# Patient Record
Sex: Male | Born: 1945 | Race: White | Hispanic: No
Health system: Southern US, Community
[De-identification: ages and names within clinical notes are randomized; demographics above are authoritative.]

---

## 2005-02-22 ENCOUNTER — Encounter: Admission: RE | Admit: 2005-02-22 | Discharge: 2005-02-22 | Payer: Self-pay | Admitting: Gastroenterology

## 2010-05-22 ENCOUNTER — Encounter: Payer: Self-pay | Admitting: Gastroenterology

## 2014-09-05 DIAGNOSIS — L03119 Cellulitis of unspecified part of limb: Secondary | ICD-10-CM | POA: Diagnosis not present

## 2014-10-08 DIAGNOSIS — H6122 Impacted cerumen, left ear: Secondary | ICD-10-CM | POA: Diagnosis not present

## 2014-10-08 DIAGNOSIS — D1720 Benign lipomatous neoplasm of skin and subcutaneous tissue of unspecified limb: Secondary | ICD-10-CM | POA: Diagnosis not present

## 2014-10-08 DIAGNOSIS — K649 Unspecified hemorrhoids: Secondary | ICD-10-CM | POA: Diagnosis not present

## 2014-10-08 DIAGNOSIS — R5383 Other fatigue: Secondary | ICD-10-CM | POA: Diagnosis not present

## 2014-10-18 ENCOUNTER — Emergency Department (HOSPITAL_COMMUNITY)
Admission: EM | Admit: 2014-10-18 | Discharge: 2014-10-19 | Disposition: A | Discharge status: Home / Self Care | Payer: Medicare Other | Attending: Emergency Medicine | Admitting: Emergency Medicine

## 2014-10-18 ENCOUNTER — Encounter (HOSPITAL_COMMUNITY): Payer: Self-pay | Admitting: Emergency Medicine

## 2014-10-18 DIAGNOSIS — Y92480 Sidewalk as the place of occurrence of the external cause: Secondary | ICD-10-CM | POA: Diagnosis not present

## 2014-10-18 DIAGNOSIS — S00511A Abrasion of lip, initial encounter: Secondary | ICD-10-CM | POA: Insufficient documentation

## 2014-10-18 DIAGNOSIS — S0450XA Injury of facial nerve, unspecified side, initial encounter: Secondary | ICD-10-CM | POA: Diagnosis not present

## 2014-10-18 DIAGNOSIS — Y998 Other external cause status: Secondary | ICD-10-CM | POA: Insufficient documentation

## 2014-10-18 DIAGNOSIS — S022XXA Fracture of nasal bones, initial encounter for closed fracture: Secondary | ICD-10-CM | POA: Diagnosis not present

## 2014-10-18 DIAGNOSIS — S161XXA Strain of muscle, fascia and tendon at neck level, initial encounter: Secondary | ICD-10-CM | POA: Diagnosis not present

## 2014-10-18 DIAGNOSIS — X58XXXA Exposure to other specified factors, initial encounter: Secondary | ICD-10-CM | POA: Diagnosis not present

## 2014-10-18 DIAGNOSIS — Y9389 Activity, other specified: Secondary | ICD-10-CM | POA: Diagnosis not present

## 2014-10-18 DIAGNOSIS — F10920 Alcohol use, unspecified with intoxication, uncomplicated: Secondary | ICD-10-CM

## 2014-10-18 DIAGNOSIS — Z0389 Encounter for observation for other suspected diseases and conditions ruled out: Secondary | ICD-10-CM | POA: Diagnosis not present

## 2014-10-18 DIAGNOSIS — F10120 Alcohol abuse with intoxication, uncomplicated: Secondary | ICD-10-CM | POA: Insufficient documentation

## 2014-10-18 DIAGNOSIS — T148 Other injury of unspecified body region: Secondary | ICD-10-CM | POA: Diagnosis not present

## 2014-10-18 DIAGNOSIS — S0990XA Unspecified injury of head, initial encounter: Secondary | ICD-10-CM | POA: Diagnosis not present

## 2014-10-18 DIAGNOSIS — M5032 Other cervical disc degeneration, mid-cervical region: Secondary | ICD-10-CM | POA: Diagnosis not present
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
## 2014-10-18 NOTE — ED Notes (Signed)
Pt transported to CT ?
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
## 2014-10-19 ENCOUNTER — Emergency Department (HOSPITAL_COMMUNITY): Payer: Medicare Other

## 2014-10-19 DIAGNOSIS — Z0389 Encounter for observation for other suspected diseases and conditions ruled out: Secondary | ICD-10-CM | POA: Diagnosis not present

## 2014-10-19 DIAGNOSIS — S022XXA Fracture of nasal bones, initial encounter for closed fracture: Secondary | ICD-10-CM | POA: Diagnosis not present

## 2014-10-19 DIAGNOSIS — M5032 Other cervical disc degeneration, mid-cervical region: Secondary | ICD-10-CM | POA: Diagnosis not present

## 2014-10-19 LAB — COMPREHENSIVE METABOLIC PANEL
ALK PHOS: 57 U/L (ref 38–126)
ALT: 15 U/L — ABNORMAL LOW (ref 17–63)
AST: 23 U/L (ref 15–41)
Albumin: 3.3 g/dL — ABNORMAL LOW (ref 3.5–5.0)
Anion gap: 12 (ref 5–15)
BUN: 11 mg/dL (ref 6–20)
CO2: 22 mmol/L (ref 22–32)
Calcium: 8.4 mg/dL — ABNORMAL LOW (ref 8.9–10.3)
Chloride: 101 mmol/L (ref 101–111)
Creatinine, Ser: 1.01 mg/dL (ref 0.61–1.24)
GFR calc non Af Amer: >60 mL/min (ref >60)
GLUCOSE: 108 mg/dL — AB (ref 65–99)
Potassium: 3 mmol/L — ABNORMAL LOW (ref 3.5–5.1)
Sodium: 135 mmol/L (ref 135–145)
TOTAL PROTEIN: 6.5 g/dL (ref 6.5–8.1)
Total Bilirubin: 0.9 mg/dL (ref 0.3–1.2)

## 2014-10-19 LAB — CBC WITH DIFFERENTIAL/PLATELET
Basophils Absolute: 0.1 10*3/uL (ref 0.0–0.1)
Basophils Relative: 1 % (ref 0–1)
Eosinophils Absolute: 0.1 10*3/uL (ref 0.0–0.7)
Eosinophils Relative: 1 % (ref 0–5)
HCT: 40.2 % (ref 39.0–52.0)
Hemoglobin: 14.5 g/dL (ref 13.0–17.0)
LYMPHS ABS: 2.4 10*3/uL (ref 0.7–4.0)
Lymphocytes Relative: 25 % (ref 12–46)
MCH: 32.2 pg (ref 26.0–34.0)
MCHC: 36.1 g/dL — ABNORMAL HIGH (ref 30.0–36.0)
MCV: 89.1 fL (ref 78.0–100.0)
Monocytes Absolute: 1 10*3/uL (ref 0.1–1.0)
Monocytes Relative: 10 % (ref 3–12)
NEUTROS ABS: 6 10*3/uL (ref 1.7–7.7)
NEUTROS PCT: 63 % (ref 43–77)
Platelets: 221 10*3/uL (ref 150–400)
RBC: 4.51 MIL/uL (ref 4.22–5.81)
RDW: 12.5 % (ref 11.5–15.5)
WBC: 9.5 10*3/uL (ref 4.0–10.5)

## 2014-10-19 LAB — ETHANOL: Alcohol, Ethyl (B): 249 mg/dL — ABNORMAL HIGH (ref <5)

## 2014-10-19 MED ORDER — SODIUM CHLORIDE 0.9 % IV SOLN
INTRAVENOUS | Status: AC | PRN
  Administered 2014-10-19: 1000 mL via INTRAVENOUS

## 2014-10-19 NOTE — ED Notes (Signed)
This RN has been speaking with pt. From what pt was telling this RN, he is depressed and lonely, but states that he does not want to hurt himself. Tamera Punt, MD and Leonette Monarch, MD notified.
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
## 2014-10-19 NOTE — ED Notes (Signed)
MD at bedside. 

## 2014-10-19 NOTE — Discharge Instructions (Signed)
Follow-up with [facility] in the next 2-3 days for a recheck.  Bacitracin twice daily over your facial wounds.  Return to the emergency department if symptoms significantly worsen or change.   Nasal Fracture A nasal fracture is a break or crack in the bones of the nose. A minor break usually heals in a month. You often will receive black eyes from a nasal fracture. This is not a cause for concern. The black eyes will go away over 1 to 2 weeks.  DIAGNOSIS  Your caregiver may want to examine you if you are concerned about a fracture of the nose. X-rays of the nose may not show a nasal fracture even when one is present. Sometimes your caregiver must wait 1 to 5 days after the injury to re-check the nose for alignment and to take additional X-rays. Sometimes the caregiver must wait until the swelling has gone down. TREATMENT Minor fractures that have caused no deformity often do not require treatment. More serious fractures where bones are displaced may require surgery. This will take place after the swelling is gone. Surgery will stabilize and align the fracture. HOME CARE INSTRUCTIONS   Put ice on the injured area.  Put ice in a plastic bag.  Place a towel between your skin and the bag.  Leave the ice on for 15-20 minutes, 03-04 times a day.  Take medications as directed by your caregiver.  Only take over-the-counter or prescription medicines for pain, discomfort, or fever as directed by your caregiver.  If your nose starts bleeding, squeeze the soft parts of the nose against the center wall while you are sitting in an upright position for 10 minutes.  Contact sports should be avoided for at least 3 to 4 weeks or as directed by your caregiver. SEEK MEDICAL CARE IF:  Your pain increases or becomes severe.  You continue to have nosebleeds.  The shape of your nose does not return to normal within 5 days.  You have pus draining from the nose. SEEK IMMEDIATE MEDICAL CARE IF:    You have bleeding from your nose that does not stop after 20 minutes of pinching the nostrils closed and keeping ice on the nose.  You have clear fluid draining from your nose.  You notice a grape-like swelling on the dividing wall between the nostrils (septum). This is a collection of blood (hematoma) that must be drained to help prevent infection.  You have difficulty moving your eyes.  You have recurrent vomiting. Document Released: 04/14/2000 Document Revised: 07/10/2011 Document Reviewed: 08/01/2010 [facility] Patient Information 2015 [place], [place]. This information is not intended to replace advice given to you by your health care provider. Make sure you discuss any questions you have with your health care provider. Emergency Department Resource Guide 1) Find a Doctor and Pay Out of Pocket Although you won't have to find out who is covered by your insurance plan, it is a good idea to ask around and get recommendations. You will then need to call the office and see if the doctor you have chosen will accept you as a new patient and what types of options they offer for patients who are self-pay. Some doctors offer discounts or will set up payment plans for their patients who do not have insurance, but you will need to ask so you aren't surprised when you get to your appointment.  2) Contact Your Local Health Department Not all health departments have doctors that can see patients for sick visits, but many  do, so it is worth a call to see if yours does. If you don't know where your local health department is, you can check in your phone book. The CDC also has a tool to help you locate your state's health department, and many state websites also have listings of all of their local health departments.  3) Find a [facility] If your illness is not likely to be very severe or complicated, you may want to try a walk in clinic. These are popping up all over the country in pharmacies,  drugstores, and shopping centers. They're usually staffed by nurse practitioners or physician assistants that have been trained to treat common illnesses and complaints. They're usually fairly quick and inexpensive. However, if you have serious medical issues or chronic medical problems, these are probably not your best option.   Chronic Pain Problems: Organization         Address     Phone             Notes  [facility]  [phone] Patients need to be referred by their primary care doctor.   Medication Assistance: Organization         Address     Phone             Notes  [facility] Medication [facility] [address] [place], [place] [address] [phone] --Must be a resident of [facility] -- Must have NO insurance coverage whatsoever (no Medicaid/ Medicare, etc.) -- The pt. MUST have a primary care doctor that directs their care regularly and follows them in the community   MedAssist  [phone]   [employer]  [phone]    Agencies that provide inexpensive medical care: Organization         Address     Phone             Notes  [place]  [phone]   [facility]    [phone]   [facility] [place]ia [place], [place] [address] [phone]   [place] [address] [place] [phone]   Planned Parenthood    [phone]   [facility]    [phone]   [place] and [address], [place] Phone:  [phone], Fax:  [phone] Hours of Operation:  9 am - 6 pm, M-F.  Also accepts Medicaid/Medicare and self-pay.  [facility] for [place], [address], [place] Phone: [phone], Fax: [phone]. Hours of Operation:  8:30 am - 5:30 pm, M-F.  Also accepts Medicaid and self-pay.  [facility] [place] [address], [place] Phone:  [phone]   [place], [place], [place] [phone], Ext. [phone] Mondays & Thursdays: 7-9 AM.  First 15 patients are seen on a first come, first serve basis.   [facility] [address] [place] [address] [phone] Accepts Medicaid   [place] Providers:  Organization         Address     Phone             Notes  [facility] [address],  [phone] Also accepts self-pay patients.  [facility] [address],  [address] [place]  [phone]   [place], [address], [place] [phone]   [facility] [address], [place] [phone]   Lucianne Lei [address], [place]   [phone] Only accepts [place] Access [place] patients after they have their name applied to their card.   Self-Pay (no insurance) in [facility]:  Organization         Address     Phone             Notes  Sickle Cell Patients, [facility] [place] [phone]   [facility] [place] [phone]   [facility] [place]  [place], [place], [place] [phone]   [facility]/Dr. Osei-Bonsu  [address] [place] or [address], [place] [phone] Phone number for both [place] and [place] locations is the same.  [facility] [address], [place] [phone]   [facility] [address], [place] or [address] [phone]   [facility] [address] [place] [phone], phone; [phone], fax Sees patients 1st and 3rd Saturday of every month.  Must not qualify for public or private insurance (i.e. Medicaid,  Medicare, [place] Health Choice, Veterans' Benefits)  Household income should be no more than 200% of the poverty level The clinic cannot treat you if you are pregnant or think you are pregnant  Sexually transmitted diseases are not treated at the clinic.    Dental Care:  Organization         Address     Phone             Notes  [facility] Department of [facility] [phone] Accepts children up to age 50 who are enrolled in [place] or [place]; pregnant women with a Medicaid card; and children who have applied for Medicaid or [place] Health Choice, but were declined, whose parents can pay a reduced fee at time of service.  [facility] Department of [facility]  [address], [place] [phone] Accepts children up to age 43 who are enrolled in [place] or [place]; pregnant women with a Medicaid card; and children who have applied for Medicaid or [place] Health Choice, but were declined, whose parents can pay a reduced fee at time of service.  [place] Adult Dental Access PROGRAM  [place] [phone] Patients are seen by appointment only. Walk-ins are not accepted. [place] will see patients 44 years of age and older. Monday - Tuesday (8am-5pm) Most Wednesdays (8:30-5pm) $30 per visit, cash only  [facility] Adult Dental Access PROGRAM  [address], [facility] [phone] Patients are seen by appointment only. Walk-ins are not accepted. [facility] will see patients 34 years of age and older. One Wednesday Evening (Monthly: Volunteer Based).  $30 per visit, cash only  [place]  [phone] for adults; Children under age 39, call Graduate Pediatric Dentistry at [phone]. Children aged 53-14, please call [phone] to request a pediatric application.  Dental services are provided in all areas of dental care including  fillings, crowns and bridges, complete  and partial dentures, implants, gum treatment, root canals, and extractions. Preventive care is also provided. Treatment is provided to both adults and children. Patients are selected via a lottery and there is often a waiting list.   [facility] [address] [place]  [phone] [email or web address]   [facility] [address] [place], [place] [phone], Ext. [phone] Second and Fourth Thursday of each month, opens at 6:30 AM; Clinic ends at 9 AM.  Patients are seen on a first-come first-served basis, and a limited number are seen during each clinic.   [facility]  [address] Hillard Danker [place], [place] [phone]   Eligibility Requirements You must have lived in [place], [place], or [place] counties for at least the last three months.   You cannot be eligible for state or federal sponsored [employer], including [employer], [place], or [employer].   You generally cannot be eligible for healthcare insurance through your employer.    How to apply: Eligibility screenings are held every Tuesday and Wednesday afternoon from 1:00 pm until 4:00 pm. You do not need an appointment for the interview!  [facility] [address] [place], [place]   [place]  [facility] Department  [place]  [phone]    Behavioral Health Resources in the Community: Intensive Outpatient Programs Organization         Address     Phone             Notes  [place]. [address] [place], [place] [phone]   [facility] Outpatient [address], [place], [place]   ADS: Alcohol & Drug Svcs [address], [place], [place]   [place] [address]  [place], [place] or [phone]      Substance Abuse Resources Organization         Address     Phone             Notes  Alcohol and Drug Services  [phone]   [place]  [phone]   [facility] [place]   Chinita Pester  [phone]   Residential & Outpatient Substance Abuse Program  [phone]   Psychological Services Organization         Address     Phone             Notes  [facility]  [place]  [phone]   [place] [address], [place] or [phone]    Mobile Crisis Teams Organization         Address     Phone             Notes  [facility]atives, Mobile Crisis Care Unit  [phone]   Assertive Psychotherapeutic Services  [address] [place], [place]   [place] Levels [address], [place] [phone]    Self-Help/Support Groups Organization         Address     Phone             Notes  [place]. of [place] - variety of support groups  Tate Call for more information  Narcotics Anonymous (NA), [facility] [address], [employer] [place]  2 meetings at this location  Residential Treatment Programs Organization         Address     Phone             Notes  [facility] [address],    [place]  [phone]   [facility]  [address] [place] [address], [place], [place]   [place], [place] [phone] Admissions: 8am-3pm M-F  Incentives Substance [place] [address],    [place], [place] [phone]   [facility] [address] [place], [place], [place]   [facility] [address]  [place], [place]   [facility]ight Programs - Intensive Outpatient [address] Kristeen Mans [address], [place], [place]   [facility] ([place].) [place].,  [place],  [place] [phone]) [phone] or [phone]   Residential Treatment Services (RTS) [address] [place], [place] Accepts Medicaid  [facility] [place] [address]  [place] [phone] Substance Abuse/Addiction Treatment   [facility] Organization         Address     Phone             Notes  [facility]  [phone]   Domenic Schwab, PhD [address] Arlis Porta [place], [place]   6617162818 or [phone]   [place], [place] [phone]   [facility] [address], [place], [place] [phone] Insurance/Medicaid/sponsorship through [facility] and Families [address] [place]                                    [place], [place] [phone] [place] [address] [place], [place] [phone]    Dr. Adele Schilder  [phone]   [facility] Dept. 1) [address], [place] 2) [place] 3)  [address], [place] [phone]  [phone]   [place] [phone] or [phone] (After Hours)     [place]: Abuse and Neglect Organization         Address     Phone             Notes  Child/Elder Abuse Hotline  843-206-1638   Family Abuse Services  (717) 375-8299 24 hour crisis line  [facility] Sexual [place]  [place] & Substance Use Organization         Address     Phone             Notes  [employer]   ([phone] 003-0881 24 hour crisis line  Advance Access  [address] [place] [phone] Monday- Friday, walk-in,  8am-8pm  RTSA Detoxification & Crisis Stabilization  [phone]   Alcoholics Anonymous (  694-503-8882 [employer]   Narcotics Anonymous  [place] & Urgent Care Centers Organization         Address     Phone             Notes  [facility] Department  [place] at  [facility]  [phone]   [facility]  [phone]   [facility]  [phone] Uninsured patients meeting eligibility requirement  [place]  [phone]      [place]  [place]  [facility]   [place]  [phone]     Additional [facility] Resources Organization         Address     Phone             Notes  [facility]  [place]  334-084-2365 Medicaid, Nutrition, Medicine Assistance, Utility Assistance  [place] 5701283407   [place] [facility]are  3185695161   [place]  [place] of Selena [place]  [phone] Adult & family shelter, food, utility & rent assistance  24 Hour crisis line for those facing homelessness  [phone]   [facility] Transit  [phone] Roxy Manns, [facility] public transportation system  Homecare Providers  3085600582 HIV/AIDS Case Management, FREE HIV SCREEN  Medication Management  [phone])[phone] Ongoing medication assistance for patients meeting eligibility requirements  Medication Drop Box Locations: [place] Dept., [facility] Police Dept., [place] Dept., [facility] office  Safely rid of unused medications  [employer]  ([phone] Crisis assistance, medication, housing, food, utility assistance  [place].  [facility])  [phone]

## 2014-10-19 NOTE — ED Notes (Signed)
NAD at this time. Pt able to ambulate independently.

## 2014-10-19 NOTE — ED Notes (Signed)
Patient is resting comfortably. 

## 2014-10-19 NOTE — ED Notes (Signed)
Pt returned from CT °

## 2014-12-11 DIAGNOSIS — R21 Rash and other nonspecific skin eruption: Secondary | ICD-10-CM | POA: Diagnosis not present

## 2015-06-15 DIAGNOSIS — Z23 Encounter for immunization: Secondary | ICD-10-CM | POA: Diagnosis not present

## 2015-06-15 DIAGNOSIS — Z0000 Encounter for general adult medical examination without abnormal findings: Secondary | ICD-10-CM | POA: Diagnosis not present

## 2015-06-15 DIAGNOSIS — Z1389 Encounter for screening for other disorder: Secondary | ICD-10-CM | POA: Diagnosis not present

## 2015-06-15 DIAGNOSIS — Z136 Encounter for screening for cardiovascular disorders: Secondary | ICD-10-CM | POA: Diagnosis not present

## 2016-06-15 DIAGNOSIS — Z0000 Encounter for general adult medical examination without abnormal findings: Secondary | ICD-10-CM | POA: Diagnosis not present

## 2016-06-15 DIAGNOSIS — Z1389 Encounter for screening for other disorder: Secondary | ICD-10-CM | POA: Diagnosis not present

## 2016-06-15 DIAGNOSIS — E7800 Pure hypercholesterolemia, unspecified: Secondary | ICD-10-CM | POA: Diagnosis not present

## 2016-06-15 DIAGNOSIS — K640 First degree hemorrhoids: Secondary | ICD-10-CM | POA: Diagnosis not present

## 2017-01-15 IMAGING — CT CT MAXILLOFACIAL W/O CM
4 of 9 series · 13 of 47 positions shown, 15 images · non-contrast
Comparison: None.

EXAM:
CT HEAD WITHOUT CONTRAST

CT MAXILLOFACIAL WITHOUT CONTRAST
CT CERVICAL SPINE WITHOUT CONTRAST
TECHNIQUE: Multidetector CT imaging of the head, cervical spine, and
maxillofacial structures were performed using the standard protocol
without intravenous contrast. Multiplanar CT image reconstructions
of the cervical spine and maxillofacial structures were also
generated.

[Series 9: sagittal soft tissue · sagittal · 0.34mm/px · 3 of 80 slices shown]
[im 20/80  bone]
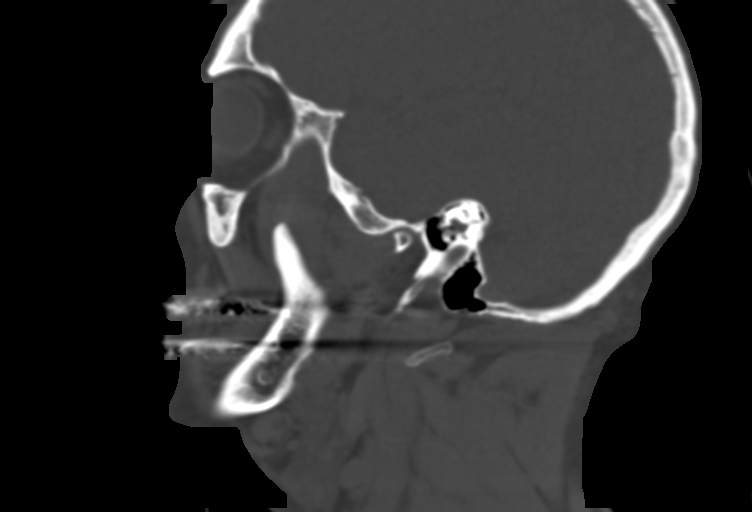
[im 40/80  bone]
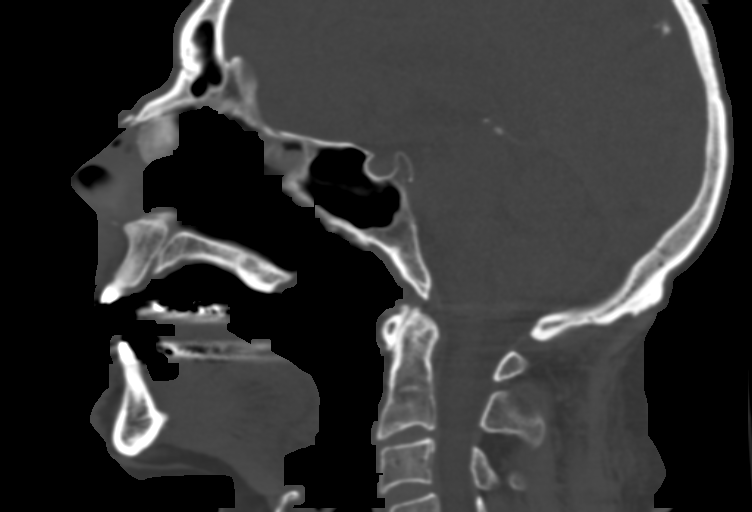
[im 60/80  bone]
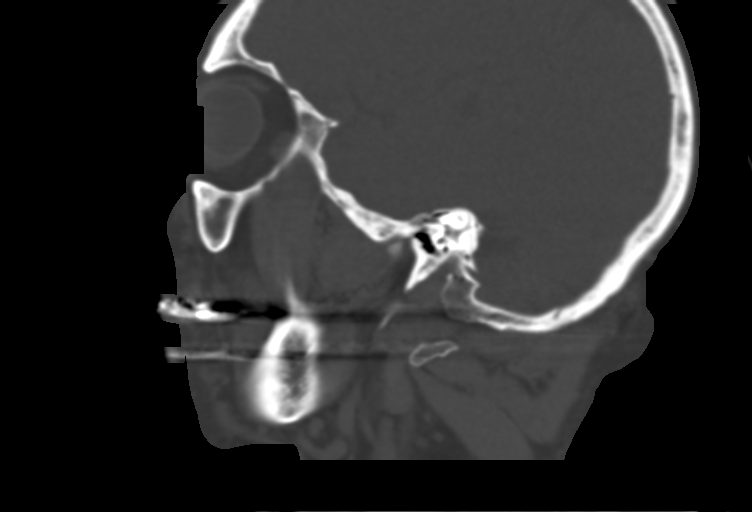

[Series 13: c_spine 2.0 i40s 3 · axial · 0.28mm/px · z∈[-321,-289]mm · 2 of 94 slices shown]
[im 16/94  bone]
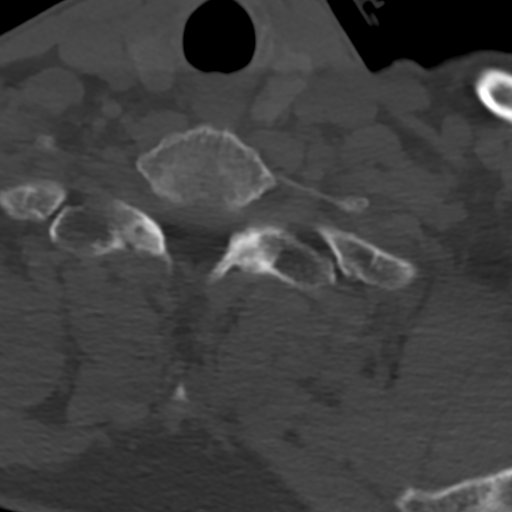
[im 32/94  bone]
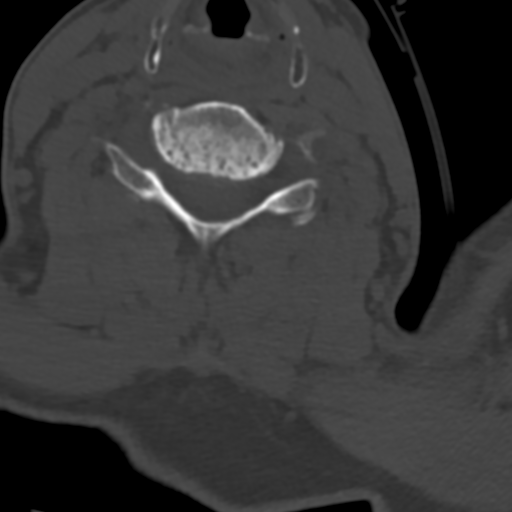

[Series 15: coronals · coronal · 0.19mm/px · 2 of 49 slices shown]
[im 13/49  bone]
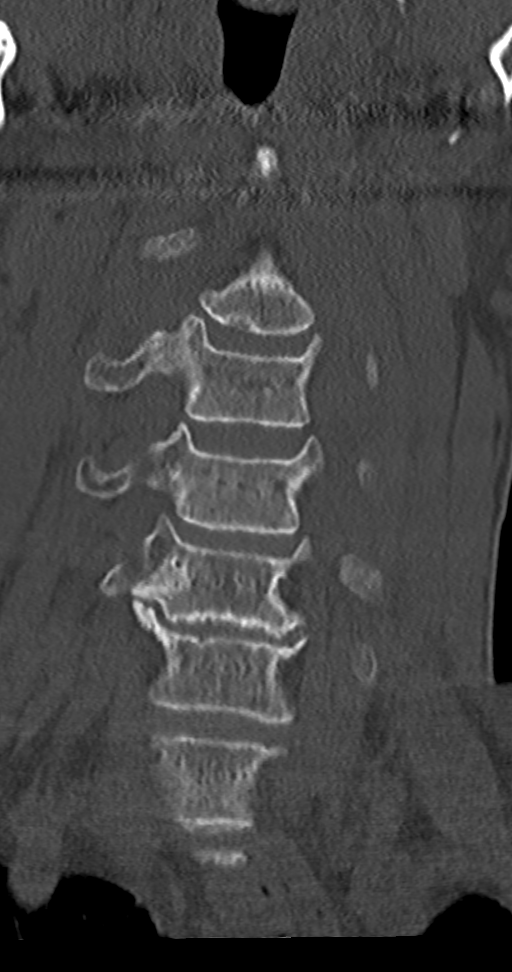
[im 31/49  bone]
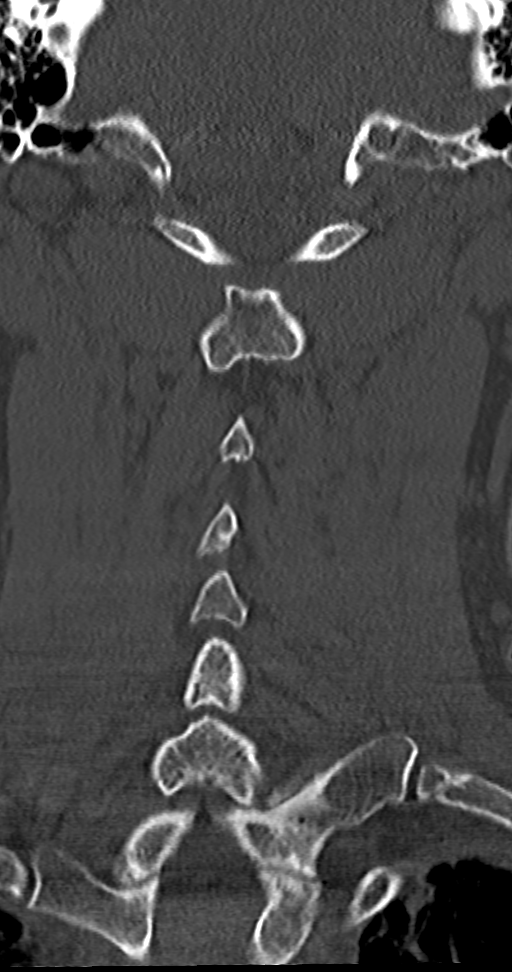

[Series 18: orthogonals · axial · 0.21mm/px · z∈[-340,-207]mm · 6 of 104 slices shown, 8 images]
[im 15/104  brain]
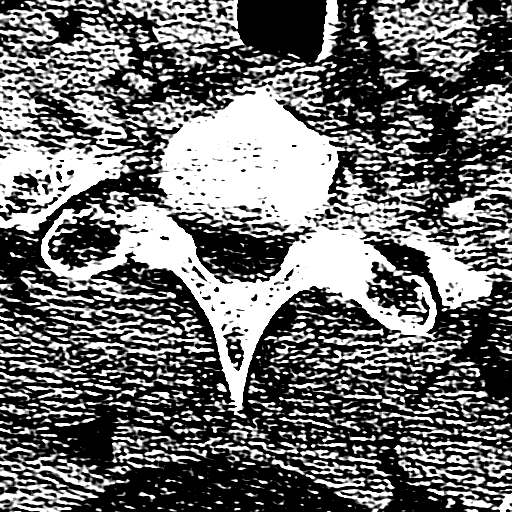
[im 15/104  bone]
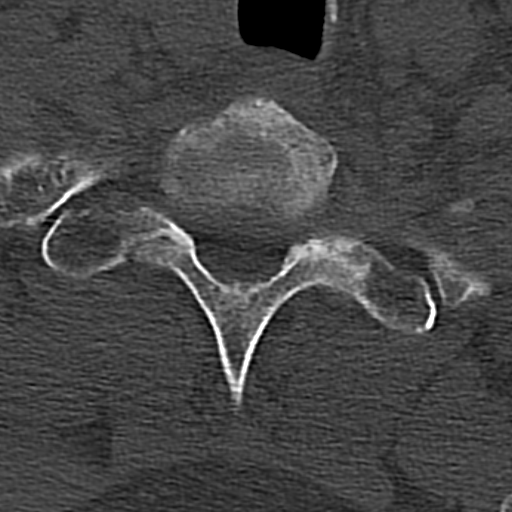
[im 30/104  bone]
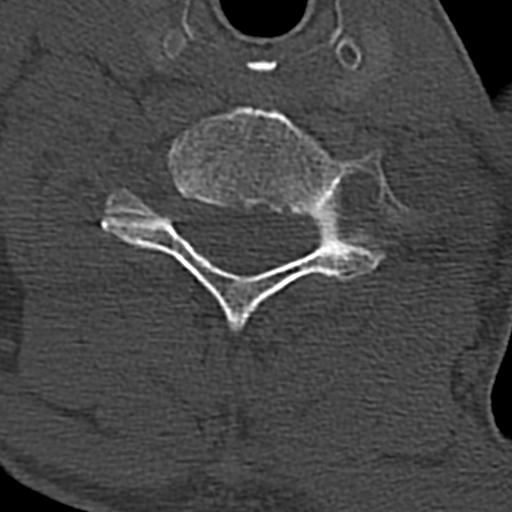
[im 45/104  bone]
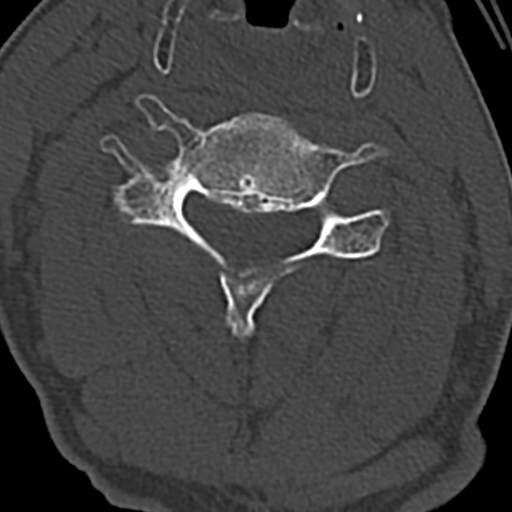
[im 59/104  bone]
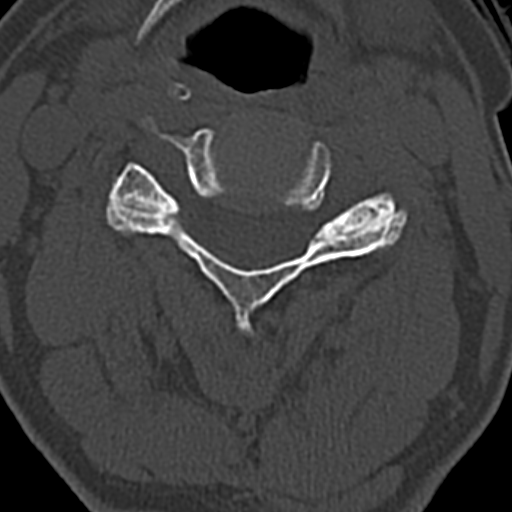
[im 74/104  brain]
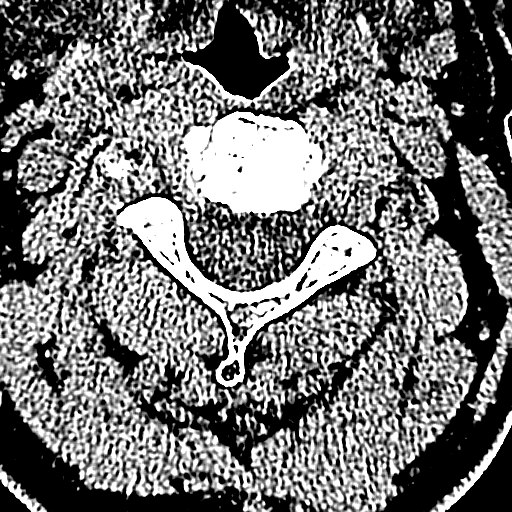
[im 74/104  bone]
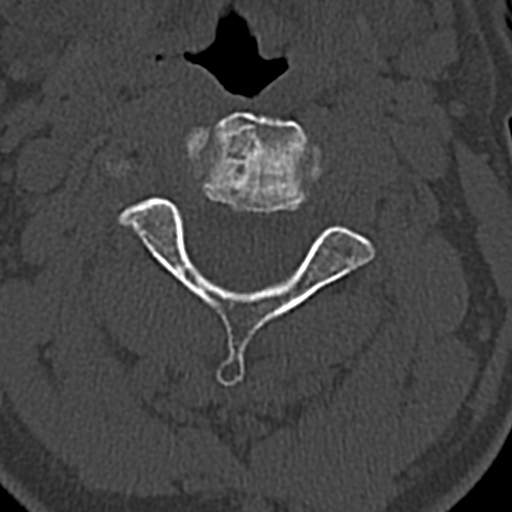
[im 89/104  bone]
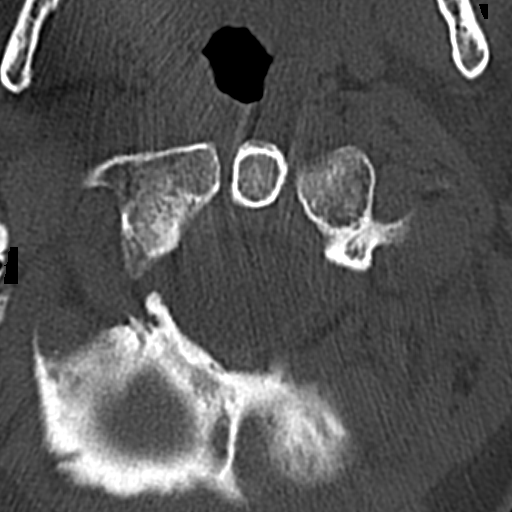

[13 of 47 positions shown; findings below may reference images not displayed]

FINDINGS: CT HEAD FINDINGS

There is no acute intracranial hemorrhage or infarct. No mass lesion
or midline shift. Gray-white matter differentiation is well
maintained. Ventricles are normal in size without evidence of
hydrocephalus. CSF containing spaces are within normal limits. No
extra-axial fluid collection. Mild chronic small vessel ischemic
changes present.

The calvarium is intact.

Orbital soft tissues are within normal limits.

The mastoid air cells are clear.

Scalp soft tissues are unremarkable.

CT MAXILLOFACIAL FINDINGS

Globes are intact. No retro-orbital process. Bony orbits are intact
without evidence orbital floor fracture. Zygomatic arches are
intact. No maxillary fracture. Pterygoid plates intact. Mandible
intact. Degenerative changes present at the right temporomandibular
joint.

There are comminuted bilateral nasal bone fractures with minimal
displacement. Probable acute nondisplaced fracture of the nasal
septum is well.

Chronic left maxillary sinus disease with desiccated secretions is
present. Mucosal thickening present within the ethmoidal air cells.
Mild mucosal thickening also present within the right maxillary
sinus.

CT CERVICAL SPINE FINDINGS

The vertebral bodies are normally aligned with preservation of the
normal cervical lordosis. Vertebral body heights are preserved.
Normal C1-2 articulations are intact. No prevertebral soft tissue
swelling. No acute fracture or listhesis. Small linear lucency
through the left lateral mass of C1 on coronal sequence favored to
reflect a normal nutrient foramen (series 15, image 21).

Moderate multilevel degenerative disc disease present as evidenced
by intervertebral disc space narrowing, endplate sclerosis, and
osteophytosis, most evident at C5-6.

2.5 cm hypodense nodule noted within the right lobe of thyroid.
Visualized lung apices are clear without evidence of apical
pneumothorax.
IMPRESSION: CT HEAD:

No acute intracranial process.

CT MAXILLOFACIAL:

1. Acute bilateral nasal bone fractures with minimal displacement.
2. Probable acute nondisplaced fracture through the nasal septum.
3. No other acute maxillofacial injury.
4. Chronic left maxillary sinus disease.

CT CERVICAL SPINE:

1. No acute traumatic injury within the cervical spine.
2. 2.5 cm right thyroid nodule, indeterminate. Further evaluation
with dedicated thyroid ultrasound recommended. This could be
performed on a nonemergent basis.

## 2017-06-21 DIAGNOSIS — Z1389 Encounter for screening for other disorder: Secondary | ICD-10-CM | POA: Diagnosis not present

## 2017-06-21 DIAGNOSIS — Z0000 Encounter for general adult medical examination without abnormal findings: Secondary | ICD-10-CM | POA: Diagnosis not present

## 2017-06-21 DIAGNOSIS — E7800 Pure hypercholesterolemia, unspecified: Secondary | ICD-10-CM | POA: Diagnosis not present

## 2017-06-21 DIAGNOSIS — R200 Anesthesia of skin: Secondary | ICD-10-CM | POA: Diagnosis not present

## 2017-10-30 DIAGNOSIS — T1512XA Foreign body in conjunctival sac, left eye, initial encounter: Secondary | ICD-10-CM | POA: Diagnosis not present

## 2018-06-27 DIAGNOSIS — Z1389 Encounter for screening for other disorder: Secondary | ICD-10-CM | POA: Diagnosis not present

## 2018-06-27 DIAGNOSIS — Z0000 Encounter for general adult medical examination without abnormal findings: Secondary | ICD-10-CM | POA: Diagnosis not present

## 2018-06-27 DIAGNOSIS — H6192 Disorder of left external ear, unspecified: Secondary | ICD-10-CM | POA: Diagnosis not present

## 2018-06-27 DIAGNOSIS — E7800 Pure hypercholesterolemia, unspecified: Secondary | ICD-10-CM | POA: Diagnosis not present

## 2019-06-30 DIAGNOSIS — Z1389 Encounter for screening for other disorder: Secondary | ICD-10-CM | POA: Diagnosis not present

## 2019-06-30 DIAGNOSIS — E7800 Pure hypercholesterolemia, unspecified: Secondary | ICD-10-CM | POA: Diagnosis not present

## 2019-06-30 DIAGNOSIS — Z0000 Encounter for general adult medical examination without abnormal findings: Secondary | ICD-10-CM | POA: Diagnosis not present

## 2019-08-09 ENCOUNTER — Ambulatory Visit: Payer: 59

## 2019-08-09 ENCOUNTER — Ambulatory Visit: Payer: Medicare Other | Attending: Internal Medicine

## 2019-08-09 DIAGNOSIS — Z23 Encounter for immunization: Secondary | ICD-10-CM

## 2019-08-09 NOTE — Progress Notes (Signed)
   Covid-[facility] Vaccination [facility]  Name:  Jesse Huerta    MRN: [id number] DOB: 09-20-1945  08/09/2019  [name] was observed post Covid-19 immunization for 15 minutes without incident. He was provided with Vaccine Information Sheet and instruction to access the V-Safe system.   [name] was instructed to call 911 with any severe reactions post vaccine: Marland Kitchen Difficulty breathing  . Swelling of face and throat  . A fast heartbeat  . A bad rash all over body  . Dizziness and weakness   Immunizations Administered    Name Date Dose VIS Date Route   Pfizer COVID-19 Vaccine 08/09/2019 10:03 AM 0.3 mL 04/11/2019 Intramuscular   Manufacturer: [place]   Lot: [id number]   [place]: [id number]

## 2019-09-01 ENCOUNTER — Ambulatory Visit: Payer: Medicare Other | Attending: Internal Medicine

## 2019-09-01 DIAGNOSIS — Z23 Encounter for immunization: Secondary | ICD-10-CM

## 2019-09-01 NOTE — Progress Notes (Signed)
   Covid-19 Vaccination Clinic  Name:  Jesse Huerta    MRN: [id number] DOB: 08-Apr-1946  09/01/2019  [name] was observed post Covid-19 immunization for 15 minutes without incident. He was provided with Vaccine Information Sheet and instruction to access the V-Safe system.   [name] was instructed to call 911 with any severe reactions post vaccine: Marland Kitchen Difficulty breathing  . Swelling of face and throat  . A fast heartbeat  . A bad rash all over body  . Dizziness and weakness   Immunizations Administered    Name Date Dose VIS Date [place] COVID-19 Vaccine 09/01/2019 12:36 PM 0.3 mL 06/25/2018 Intramuscular   Manufacturer: [place]   Lot: [id number]   [place]: [id number]

## 2020-07-01 DIAGNOSIS — R946 Abnormal results of thyroid function studies: Secondary | ICD-10-CM | POA: Diagnosis not present

## 2020-07-01 DIAGNOSIS — Z0000 Encounter for general adult medical examination without abnormal findings: Secondary | ICD-10-CM | POA: Diagnosis not present

## 2020-07-01 DIAGNOSIS — R030 Elevated blood-pressure reading, without diagnosis of hypertension: Secondary | ICD-10-CM | POA: Diagnosis not present

## 2020-07-01 DIAGNOSIS — E7800 Pure hypercholesterolemia, unspecified: Secondary | ICD-10-CM | POA: Diagnosis not present

## 2020-07-01 DIAGNOSIS — Z1389 Encounter for screening for other disorder: Secondary | ICD-10-CM | POA: Diagnosis not present

## 2021-07-12 DIAGNOSIS — Z1389 Encounter for screening for other disorder: Secondary | ICD-10-CM | POA: Diagnosis not present

## 2021-07-12 DIAGNOSIS — R030 Elevated blood-pressure reading, without diagnosis of hypertension: Secondary | ICD-10-CM | POA: Diagnosis not present

## 2021-07-12 DIAGNOSIS — G5601 Carpal tunnel syndrome, right upper limb: Secondary | ICD-10-CM | POA: Diagnosis not present

## 2021-07-12 DIAGNOSIS — Z0000 Encounter for general adult medical examination without abnormal findings: Secondary | ICD-10-CM | POA: Diagnosis not present

## 2022-05-31 DIAGNOSIS — K080 Exfoliation of teeth due to systemic causes: Secondary | ICD-10-CM | POA: Diagnosis not present

## 2022-06-21 DIAGNOSIS — K080 Exfoliation of teeth due to systemic causes: Secondary | ICD-10-CM | POA: Diagnosis not present

## 2022-07-17 DIAGNOSIS — E7800 Pure hypercholesterolemia, unspecified: Secondary | ICD-10-CM | POA: Diagnosis not present

## 2022-07-17 DIAGNOSIS — Z0000 Encounter for general adult medical examination without abnormal findings: Secondary | ICD-10-CM | POA: Diagnosis not present

## 2022-07-17 DIAGNOSIS — Z1331 Encounter for screening for depression: Secondary | ICD-10-CM | POA: Diagnosis not present

## 2022-07-17 DIAGNOSIS — G5601 Carpal tunnel syndrome, right upper limb: Secondary | ICD-10-CM | POA: Diagnosis not present

## 2022-10-09 DIAGNOSIS — K080 Exfoliation of teeth due to systemic causes: Secondary | ICD-10-CM | POA: Diagnosis not present

## 2023-04-12 DIAGNOSIS — K080 Exfoliation of teeth due to systemic causes: Secondary | ICD-10-CM | POA: Diagnosis not present

## 2023-05-21 DIAGNOSIS — K080 Exfoliation of teeth due to systemic causes: Secondary | ICD-10-CM | POA: Diagnosis not present

## 2023-06-11 DIAGNOSIS — K080 Exfoliation of teeth due to systemic causes: Secondary | ICD-10-CM | POA: Diagnosis not present

## 2023-07-10 DIAGNOSIS — K080 Exfoliation of teeth due to systemic causes: Secondary | ICD-10-CM | POA: Diagnosis not present

## 2023-07-18 DIAGNOSIS — E7800 Pure hypercholesterolemia, unspecified: Secondary | ICD-10-CM | POA: Diagnosis not present

## 2023-07-18 DIAGNOSIS — G5601 Carpal tunnel syndrome, right upper limb: Secondary | ICD-10-CM | POA: Diagnosis not present

## 2023-07-18 DIAGNOSIS — Z0000 Encounter for general adult medical examination without abnormal findings: Secondary | ICD-10-CM | POA: Diagnosis not present

## 2023-07-18 DIAGNOSIS — Z1389 Encounter for screening for other disorder: Secondary | ICD-10-CM | POA: Diagnosis not present

## 2023-07-18 DIAGNOSIS — R030 Elevated blood-pressure reading, without diagnosis of hypertension: Secondary | ICD-10-CM | POA: Diagnosis not present

## 2023-07-18 DIAGNOSIS — F1011 Alcohol abuse, in remission: Secondary | ICD-10-CM | POA: Diagnosis not present

## 2023-10-16 DIAGNOSIS — K080 Exfoliation of teeth due to systemic causes: Secondary | ICD-10-CM | POA: Diagnosis not present

## 2024-02-28 DIAGNOSIS — M5431 Sciatica, right side: Secondary | ICD-10-CM | POA: Diagnosis not present

## 2024-02-28 DIAGNOSIS — K6289 Other specified diseases of anus and rectum: Secondary | ICD-10-CM | POA: Diagnosis not present

## 2024-02-28 DIAGNOSIS — R030 Elevated blood-pressure reading, without diagnosis of hypertension: Secondary | ICD-10-CM | POA: Diagnosis not present

## 2024-02-28 DIAGNOSIS — R1032 Left lower quadrant pain: Secondary | ICD-10-CM | POA: Diagnosis not present

## 2024-04-02 DIAGNOSIS — M549 Dorsalgia, unspecified: Secondary | ICD-10-CM | POA: Diagnosis not present

## 2024-04-02 DIAGNOSIS — M62541 Muscle wasting and atrophy, not elsewhere classified, right hand: Secondary | ICD-10-CM | POA: Diagnosis not present

## 2024-04-15 DIAGNOSIS — K080 Exfoliation of teeth due to systemic causes: Secondary | ICD-10-CM | POA: Diagnosis not present
